# Patient Record
Sex: Male | Born: 1975 | Race: White | Hispanic: No | Marital: Single | Smoking: Never smoker
Health system: Southern US, Community
[De-identification: ages and names within clinical notes are randomized; demographics above are authoritative.]

## PROBLEM LIST (undated history)

## (undated) HISTORY — PX: MANDIBLE RECONSTRUCTION: SHX431

---

## 1997-11-08 ENCOUNTER — Emergency Department (HOSPITAL_COMMUNITY): Admission: EM | Admit: 1997-11-08 | Discharge: 1997-11-08 | Payer: Self-pay | Admitting: Internal Medicine

## 2012-03-05 ENCOUNTER — Encounter (HOSPITAL_COMMUNITY): Payer: Self-pay | Admitting: Emergency Medicine

## 2012-03-05 ENCOUNTER — Emergency Department (HOSPITAL_COMMUNITY)
Admission: EM | Admit: 2012-03-05 | Discharge: 2012-03-05 | Disposition: A | Discharge status: Home / Self Care | Payer: Self-pay | Attending: Emergency Medicine | Admitting: Emergency Medicine

## 2012-03-05 DIAGNOSIS — J3489 Other specified disorders of nose and nasal sinuses: Secondary | ICD-10-CM | POA: Insufficient documentation

## 2012-03-05 DIAGNOSIS — R5381 Other malaise: Secondary | ICD-10-CM | POA: Insufficient documentation

## 2012-03-05 DIAGNOSIS — R197 Diarrhea, unspecified: Secondary | ICD-10-CM | POA: Insufficient documentation

## 2012-03-05 DIAGNOSIS — R05 Cough: Secondary | ICD-10-CM | POA: Insufficient documentation

## 2012-03-05 DIAGNOSIS — R630 Anorexia: Secondary | ICD-10-CM | POA: Insufficient documentation

## 2012-03-05 DIAGNOSIS — J111 Influenza due to unidentified influenza virus with other respiratory manifestations: Secondary | ICD-10-CM

## 2012-03-05 DIAGNOSIS — R110 Nausea: Secondary | ICD-10-CM | POA: Insufficient documentation

## 2012-03-05 DIAGNOSIS — R51 Headache: Secondary | ICD-10-CM | POA: Insufficient documentation

## 2012-03-05 DIAGNOSIS — R6883 Chills (without fever): Secondary | ICD-10-CM | POA: Insufficient documentation

## 2012-03-05 DIAGNOSIS — IMO0001 Reserved for inherently not codable concepts without codable children: Secondary | ICD-10-CM | POA: Insufficient documentation

## 2012-03-05 DIAGNOSIS — R059 Cough, unspecified: Secondary | ICD-10-CM | POA: Insufficient documentation

## 2012-03-05 DIAGNOSIS — Z79899 Other long term (current) drug therapy: Secondary | ICD-10-CM | POA: Insufficient documentation

## 2012-03-05 NOTE — ED Notes (Signed)
Pt c/o not feeling well for for days with generalized body aches and chills.VSS

## 2012-03-05 NOTE — ED Provider Notes (Signed)
History     CSN: [id number]  Arrival date & time 03/05/12  2013   First MD Initiated Contact with Patient 03/05/12 2133      Chief Complaint  Patient presents with  . Generalized Body Aches    (Consider location/radiation/quality/duration/timing/severity/associated sxs/prior treatment) HPI Comments: Denies getting flu shot this year  Patient is a 37 y.o. male presenting with flu symptoms. The history is provided by the patient. No language interpreter was used.  Influenza Presenting symptoms: cough, diarrhea, fatigue, headache, myalgias, nausea and rhinorrhea   Presenting symptoms: no fever, no shortness of breath and no vomiting   Severity:  Mild Onset quality:  Gradual Duration:  2 days Progression:  Waxing and waning Chronicity:  New Relieved by:  Nothing Worsened by:  Nothing tried Ineffective treatments:  Hot fluids Associated symptoms: decreased appetite and decreased physical activity   Associated symptoms: no chills, no ear pain, no congestion and no witnessed syncope   Risk factors: sick contacts     History reviewed. No pertinent past medical history.  History reviewed. No pertinent past surgical history.  No family history on file.  History  Substance Use Topics  . Smoking status: Never Smoker   . Smokeless tobacco: Not on file  . Alcohol Use: No      Review of Systems  Constitutional: Positive for fatigue and decreased appetite. Negative for fever and chills.  HENT: Positive for rhinorrhea. Negative for ear pain and congestion.   Respiratory: Positive for cough. Negative for shortness of breath.   Gastrointestinal: Positive for nausea and diarrhea. Negative for vomiting.  Genitourinary: Negative.   Musculoskeletal: Positive for myalgias.  Neurological: Positive for headaches.    Allergies  Review of patient's allergies indicates no known allergies.  Home Medications   Current Outpatient Rx  Name  Route  Sig  Dispense  Refill  .  amphetamine-dextroamphetamine (ADDERALL) 20 MG tablet   Oral   Take 20 mg by mouth 2 (two) times daily.         . Vilazodone HCl (VIIBRYD) 20 MG TABS   Oral   Take 30 mg by mouth daily.           BP 141/84  Pulse 94  Temp(Src) 97.9 F (36.6 C) (Oral)  Resp 20  SpO2 99%  Physical Exam  Nursing note and vitals reviewed. Constitutional: He is oriented to person, place, and time. He appears well-developed and well-nourished. No distress.  HENT:  Head: Normocephalic and atraumatic.  Right Ear: External ear normal.  Left Ear: External ear normal.  Nose: Nose normal.  Mouth/Throat: Oropharynx is clear and moist. No oropharyngeal exudate.  Eyes: Conjunctivae are normal. Pupils are equal, round, and reactive to light. Right eye exhibits no discharge. Left eye exhibits no discharge. No scleral icterus.  Neck: Normal range of motion.  Cardiovascular: Normal rate, regular rhythm and normal heart sounds.   Pulmonary/Chest: Effort normal and breath sounds normal. No respiratory distress. He has no wheezes. He has no rales.  Abdominal: Soft. He exhibits no distension. There is no tenderness.  Musculoskeletal: Normal range of motion.  Lymphadenopathy:    He has no cervical adenopathy.  Neurological: He is alert and oriented to person, place, and time.  Skin: Skin is warm. No rash noted. He is not diaphoretic. No erythema.  Psychiatric: He has a normal mood and affect. His behavior is normal.    ED Course  Procedures (including critical care time)  Labs Reviewed - No data to display No results  found.   1. Influenza       MDM  Patient presents for body aches and chills x days. Works at [employer] where the flu has been going around through different employees. Associated symptoms include headache, nausea, diarrhea, and rhinorrhea. Is able to tolerate liquids and solids. Patient nontoxic, well appearing, and afebrile in ED. Physical exam benign. Patient's symptoms and exposure  are consistent with a diagnosis of influenza. Patient is outside the window for Tamiflu efficacy. Have verbally advised patient to remain out of work for a week, to rest, remain well hydrated, and to take tylenol or advil as needed for myalgias. Patient states his understanding and comfort with this plan. Patient advised to return to ED if symptoms worsen.  Filed Vitals:   03/05/12 2104  BP: 141/84  Pulse: 94  Temp: 97.9 F (36.6 C)  TempSrc: Oral  Resp: 20  SpO2: 99%          Antony Madura, PA-C 03/06/12 [phone]

## 2012-03-06 NOTE — ED Provider Notes (Signed)
Medical screening examination/treatment/procedure(s) were performed by non-physician practitioner and as supervising physician I was immediately available for consultation/collaboration.   Bobbye Petti M Katana Berthold, MD 03/06/12 1654 

## 2012-09-24 ENCOUNTER — Encounter (HOSPITAL_COMMUNITY): Payer: Self-pay | Admitting: Emergency Medicine

## 2012-09-24 ENCOUNTER — Emergency Department (HOSPITAL_COMMUNITY)
Admission: EM | Admit: 2012-09-24 | Discharge: 2012-09-24 | Disposition: A | Discharge status: Home / Self Care | Payer: Self-pay | Attending: Emergency Medicine | Admitting: Emergency Medicine

## 2012-09-24 DIAGNOSIS — T25429A Corrosion of unspecified degree of unspecified foot, initial encounter: Secondary | ICD-10-CM

## 2012-09-24 DIAGNOSIS — Z79899 Other long term (current) drug therapy: Secondary | ICD-10-CM | POA: Insufficient documentation

## 2012-09-24 DIAGNOSIS — Y929 Unspecified place or not applicable: Secondary | ICD-10-CM | POA: Insufficient documentation

## 2012-09-24 DIAGNOSIS — IMO0002 Reserved for concepts with insufficient information to code with codable children: Secondary | ICD-10-CM | POA: Insufficient documentation

## 2012-09-24 DIAGNOSIS — T23409A Corrosion of unspecified degree of unspecified hand, unspecified site, initial encounter: Secondary | ICD-10-CM

## 2012-09-24 DIAGNOSIS — T25029A Burn of unspecified degree of unspecified foot, initial encounter: Secondary | ICD-10-CM | POA: Insufficient documentation

## 2012-09-24 DIAGNOSIS — Y939 Activity, unspecified: Secondary | ICD-10-CM | POA: Insufficient documentation

## 2012-09-24 MED ORDER — DEXAMETHASONE SODIUM PHOSPHATE 10 MG/ML IJ SOLN
10.0000 mg | Freq: Once | INTRAMUSCULAR | Status: AC
  Administered 2012-09-24: 10 mg via INTRAMUSCULAR
  Filled 2012-09-24: qty 1
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
## 2012-09-24 NOTE — ED Provider Notes (Signed)
Medical screening examination/treatment/procedure(s) were performed by non-physician practitioner and as supervising physician I was immediately available for consultation/collaboration.   Isabeau Mccalla H Darnelle Derrick, MD 09/24/12 2316 
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
## 2016-01-13 ENCOUNTER — Emergency Department (HOSPITAL_COMMUNITY)
Admission: EM | Admit: 2016-01-13 | Discharge: 2016-01-13 | Disposition: A | Discharge status: Home / Self Care | Payer: Self-pay | Attending: Emergency Medicine | Admitting: Emergency Medicine

## 2016-01-13 ENCOUNTER — Emergency Department (HOSPITAL_COMMUNITY): Payer: Self-pay

## 2016-01-13 ENCOUNTER — Encounter (HOSPITAL_COMMUNITY): Payer: Self-pay | Admitting: Emergency Medicine

## 2016-01-13 DIAGNOSIS — M79642 Pain in left hand: Secondary | ICD-10-CM | POA: Insufficient documentation

## 2016-01-13 DIAGNOSIS — R059 Cough, unspecified: Secondary | ICD-10-CM

## 2016-01-13 DIAGNOSIS — R05 Cough: Secondary | ICD-10-CM

## 2016-01-13 DIAGNOSIS — M79641 Pain in right hand: Secondary | ICD-10-CM | POA: Insufficient documentation

## 2016-01-13 DIAGNOSIS — J069 Acute upper respiratory infection, unspecified: Secondary | ICD-10-CM | POA: Insufficient documentation

## 2016-01-13 DIAGNOSIS — R51 Headache: Secondary | ICD-10-CM | POA: Insufficient documentation

## 2016-01-13 DIAGNOSIS — R509 Fever, unspecified: Secondary | ICD-10-CM | POA: Insufficient documentation

## 2016-01-13 LAB — LIPASE, BLOOD: Lipase: 34 U/L (ref 11–51)

## 2016-01-13 LAB — CBC WITH DIFFERENTIAL/PLATELET
BASOS PCT: 1 %
Basophils Absolute: 0.1 10*3/uL (ref 0.0–0.1)
Eosinophils Absolute: 0.3 10*3/uL (ref 0.0–0.7)
Eosinophils Relative: 3 %
HEMATOCRIT: 44.2 % (ref 39.0–52.0)
HEMOGLOBIN: 15.4 g/dL (ref 13.0–17.0)
LYMPHS ABS: 2.6 10*3/uL (ref 0.7–4.0)
LYMPHS PCT: 27 %
MCH: 28.8 pg (ref 26.0–34.0)
MCHC: 34.8 g/dL (ref 30.0–36.0)
MCV: 82.8 fL (ref 78.0–100.0)
MONO ABS: 1 10*3/uL (ref 0.1–1.0)
MONOS PCT: 10 %
NEUTROS ABS: 5.6 10*3/uL (ref 1.7–7.7)
NEUTROS PCT: 59 %
Platelets: 282 10*3/uL (ref 150–400)
RBC: 5.34 MIL/uL (ref 4.22–5.81)
RDW: 13 % (ref 11.5–15.5)
WBC: 9.5 10*3/uL (ref 4.0–10.5)

## 2016-01-13 LAB — URINALYSIS, ROUTINE W REFLEX MICROSCOPIC
Bilirubin Urine: NEGATIVE
GLUCOSE, UA: NEGATIVE mg/dL
Hgb urine dipstick: NEGATIVE
KETONES UR: NEGATIVE mg/dL
LEUKOCYTES UA: NEGATIVE
Nitrite: NEGATIVE
PH: 7 (ref 5.0–8.0)
Protein, ur: NEGATIVE mg/dL
Specific Gravity, Urine: 1.009 (ref 1.005–1.030)

## 2016-01-13 LAB — I-STAT CG4 LACTIC ACID, ED: LACTIC ACID, VENOUS: 1.87 mmol/L (ref 0.5–1.9)

## 2016-01-13 LAB — COMPREHENSIVE METABOLIC PANEL
ALBUMIN: 4.2 g/dL (ref 3.5–5.0)
ALK PHOS: 53 U/L (ref 38–126)
ALT: 73 U/L — ABNORMAL HIGH (ref 17–63)
ANION GAP: 10 (ref 5–15)
AST: 39 U/L (ref 15–41)
BILIRUBIN TOTAL: 0.5 mg/dL (ref 0.3–1.2)
BUN: 17 mg/dL (ref 6–20)
CALCIUM: 9.7 mg/dL (ref 8.9–10.3)
CO2: 27 mmol/L (ref 22–32)
Chloride: 100 mmol/L — ABNORMAL LOW (ref 101–111)
Creatinine, Ser: 0.82 mg/dL (ref 0.61–1.24)
GFR calc Af Amer: >60 mL/min (ref >60)
GLUCOSE: 101 mg/dL — AB (ref 65–99)
Potassium: 3.9 mmol/L (ref 3.5–5.1)
Sodium: 137 mmol/L (ref 135–145)
TOTAL PROTEIN: 7.3 g/dL (ref 6.5–8.1)

## 2016-01-13 LAB — PROTIME-INR
INR: 0.88
PROTHROMBIN TIME: 11.9 s (ref 11.4–15.2)

## 2016-01-13 LAB — D-DIMER, QUANTITATIVE: D-Dimer, Quant: <0.27 ug/mL-FEU (ref 0.00–0.50)

## 2016-01-13 MED ORDER — DEXAMETHASONE SODIUM PHOSPHATE 10 MG/ML IJ SOLN
10.0000 mg | Freq: Once | INTRAMUSCULAR | Status: AC
  Administered 2016-01-13: 10 mg via INTRAVENOUS
  Filled 2016-01-13: qty 1

## 2016-01-13 MED ORDER — SODIUM CHLORIDE 0.9 % IV BOLUS (SEPSIS)
1000.0000 mL | Freq: Once | INTRAVENOUS | Status: AC
  Administered 2016-01-13: 1000 mL via INTRAVENOUS

## 2016-01-13 MED ORDER — PROCHLORPERAZINE EDISYLATE 5 MG/ML IJ SOLN
10.0000 mg | Freq: Once | INTRAMUSCULAR | Status: AC
  Administered 2016-01-13: 10 mg via INTRAVENOUS
  Filled 2016-01-13: qty 2

## 2016-01-13 MED ORDER — DIPHENHYDRAMINE HCL 50 MG/ML IJ SOLN
25.0000 mg | Freq: Once | INTRAMUSCULAR | Status: AC
  Administered 2016-01-13: 25 mg via INTRAVENOUS
  Filled 2016-01-13: qty 1
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
## 2016-01-13 NOTE — Discharge Instructions (Signed)
Please follow-up with your PCP for further management of your symptoms. Please follow-up with your pain team for further pain management. Please follow-up with Dr. Lafayette Dragonarr for further management of her depression. If any symptoms worsen or new symptoms arise, please return to the nearest emergency department.

## 2016-01-13 NOTE — ED Triage Notes (Signed)
Patient reports "sore kidneys 2 weeks ago."This was around the time the patient had medication change. Also, reports intermittent headaches, sore throat, and bilateral ear pain x1 week. Also reports left middle finger pain after a fall a few weeks ago.

## 2016-01-13 NOTE — ED Notes (Signed)
Pt has a urinal at bedside and is aware that a sample is needed.

## 2016-01-14 LAB — URINE CULTURE: Culture: NO GROWTH

## 2018-07-04 IMAGING — CR DG CHEST 2V
2 series · 2 of 2 positions shown · non-contrast
Comparison: None.

CLINICAL DATA: Cough.  Fever.

EXAM:
CHEST  2 VIEW

[w chest pa]
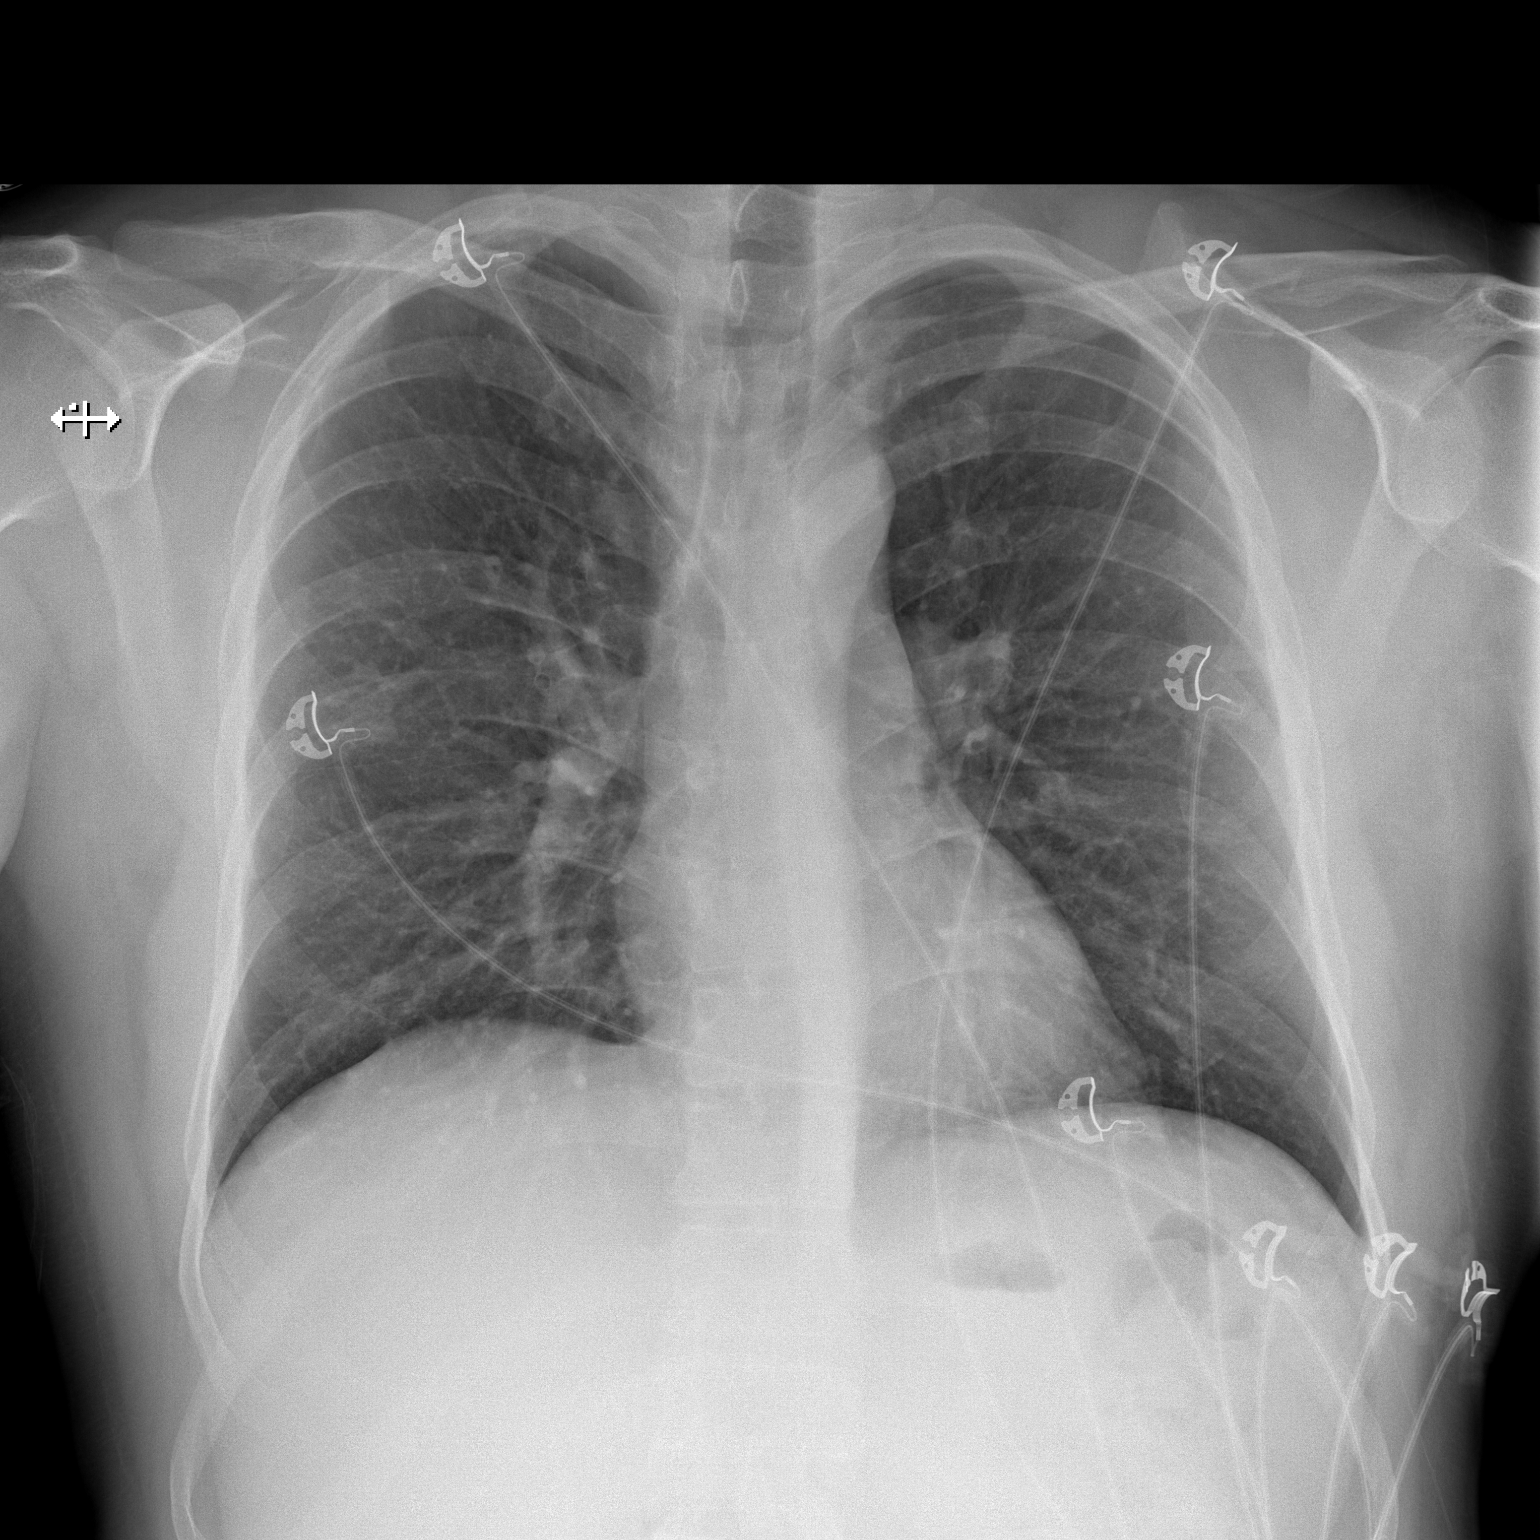

[w chest lat]
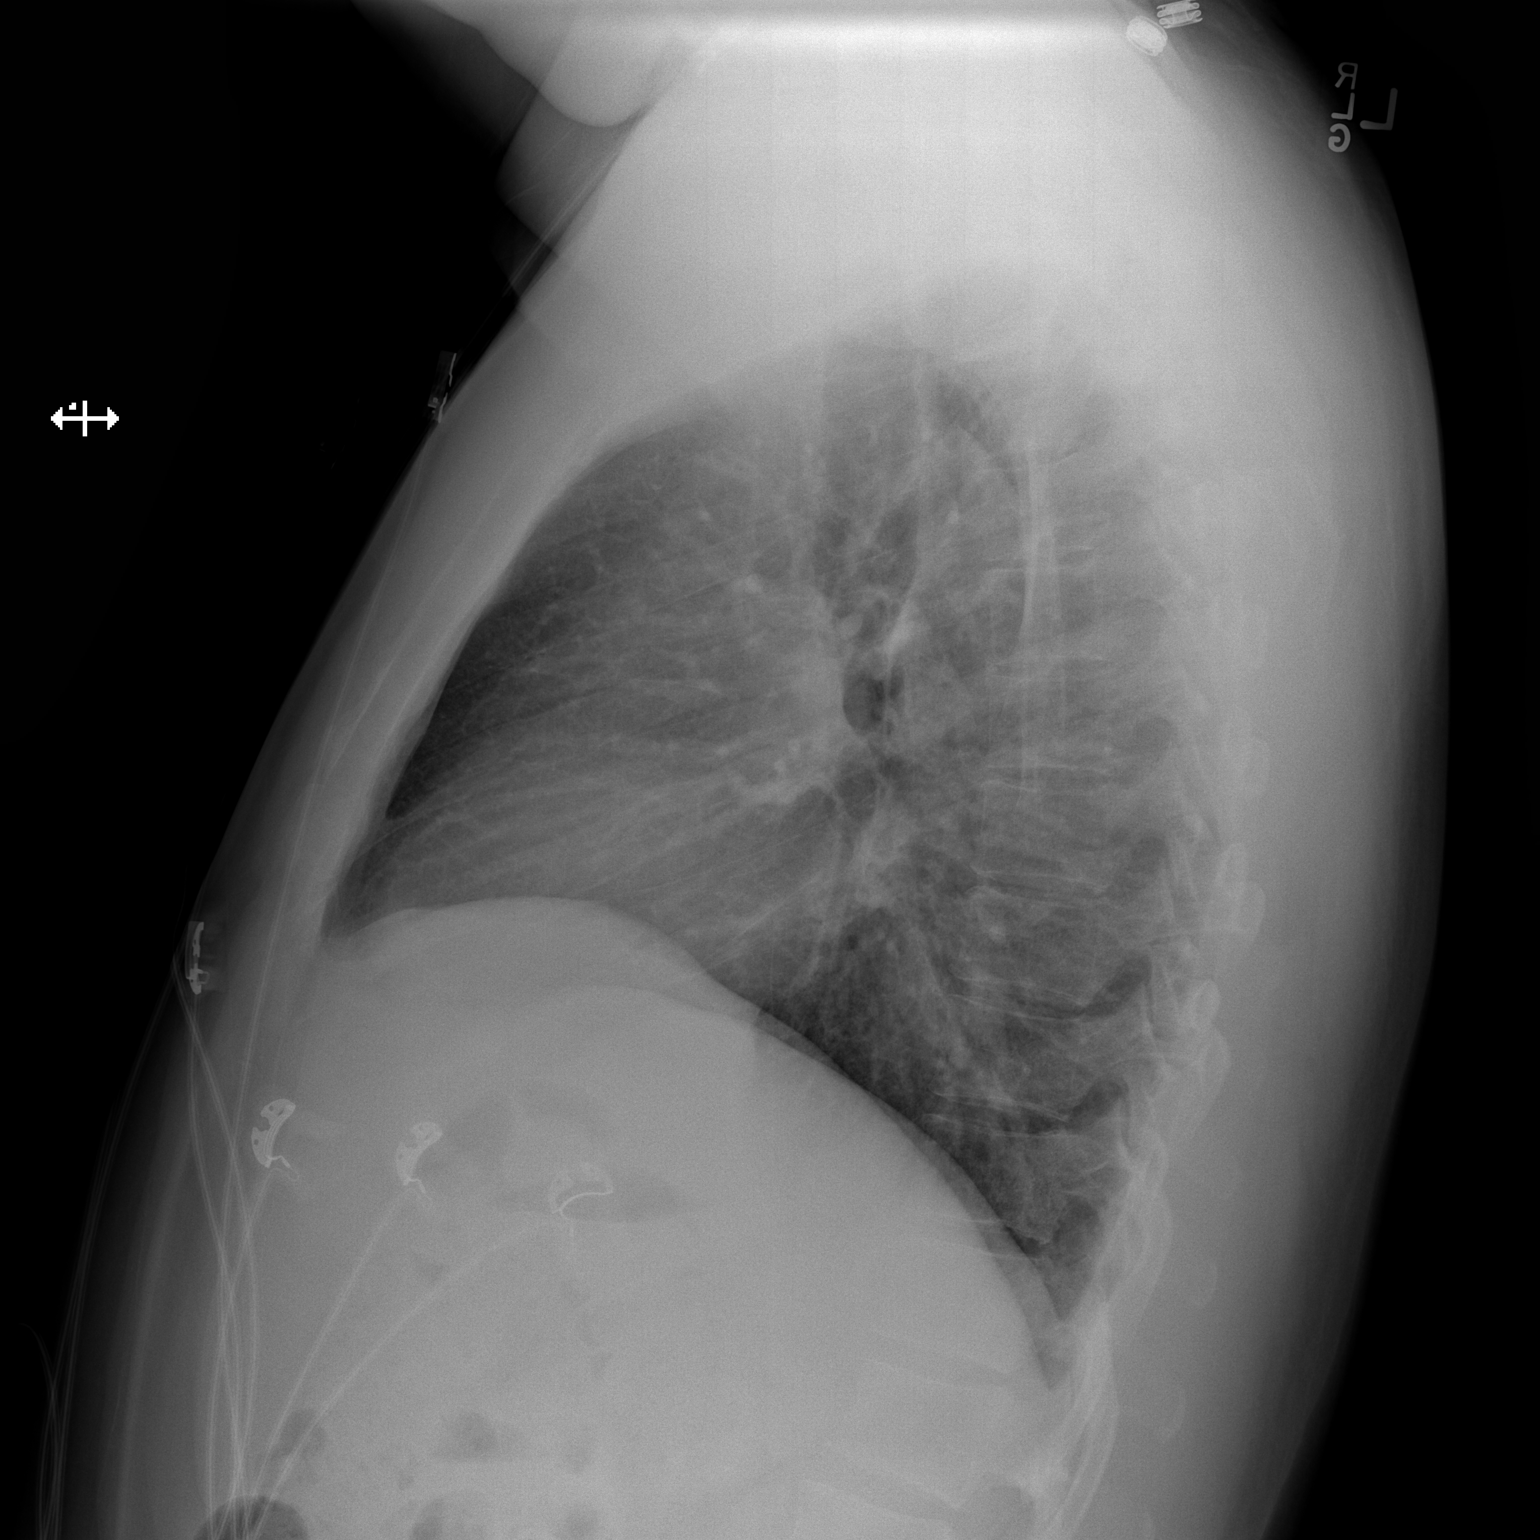

[2 of 2 positions shown; findings below may reference images not displayed]

FINDINGS: The heart size and mediastinal contours are within normal limits.
Both lungs are clear. The visualized skeletal structures are
unremarkable.
IMPRESSION: No active cardiopulmonary disease.

## 2018-07-04 IMAGING — CR DG HAND COMPLETE 3+V*L*
3 series · 3 of 3 positions shown · non-contrast
Comparison: None.

CLINICAL DATA: Bilateral chronic hand pain.

EXAM:
LEFT HAND - COMPLETE 3+ VIEW

[x hand pa left]
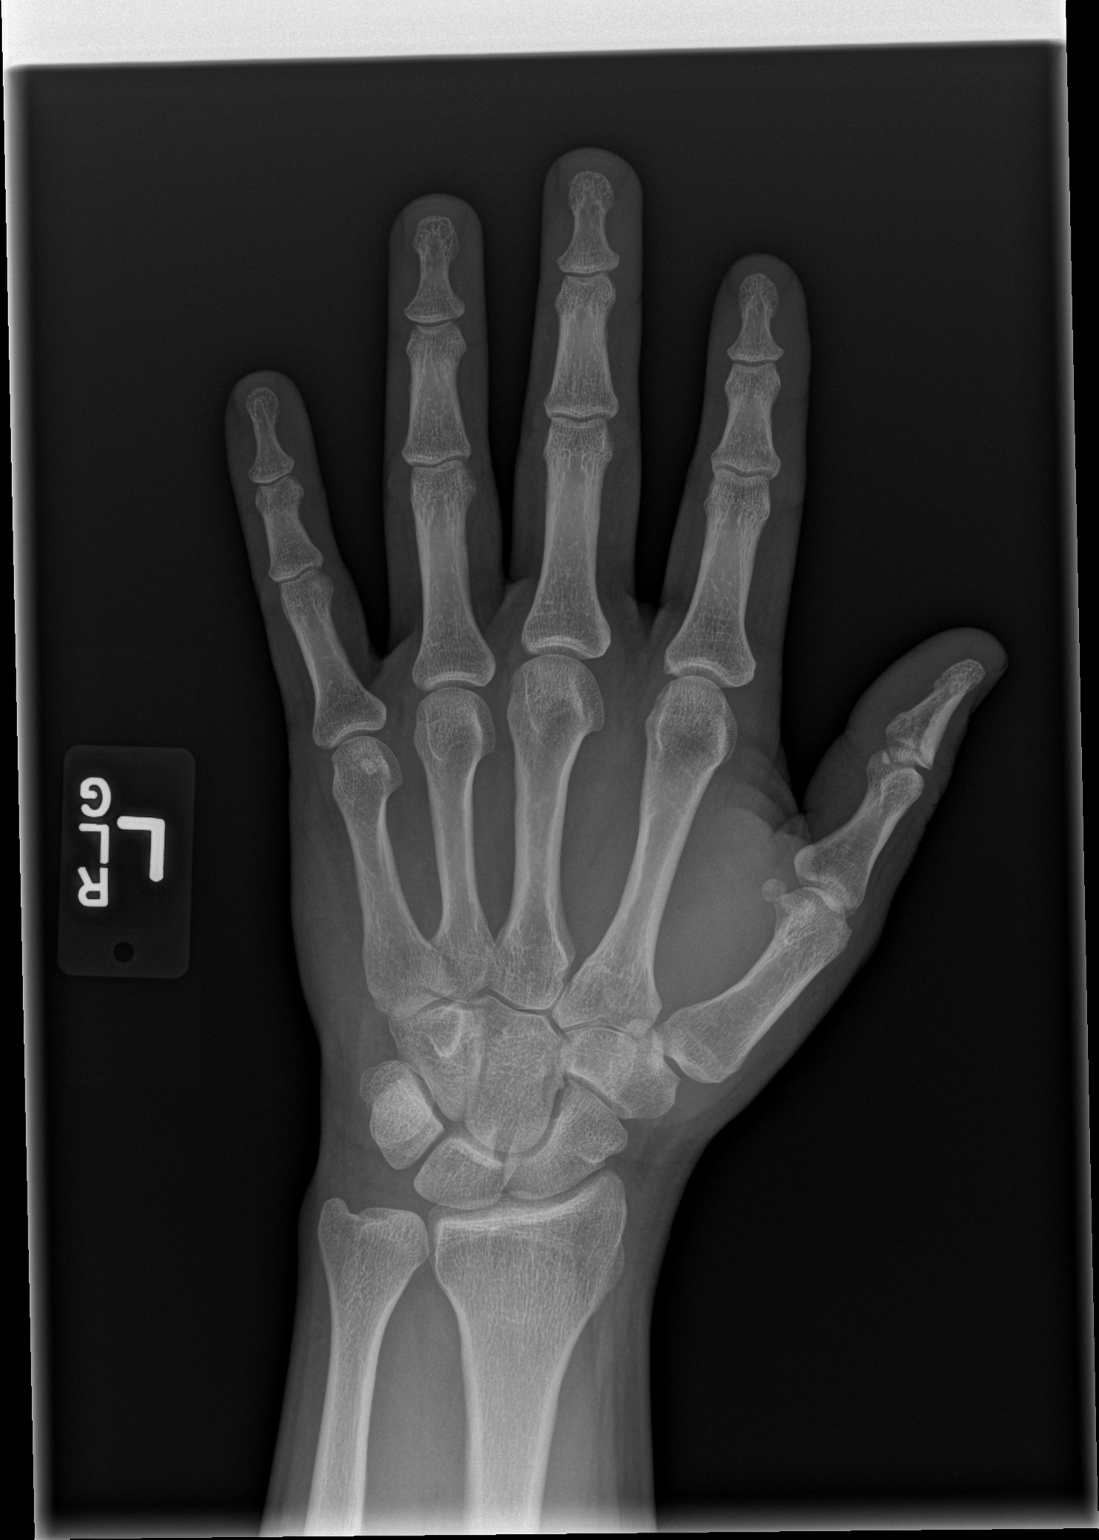

[x hand obl left]
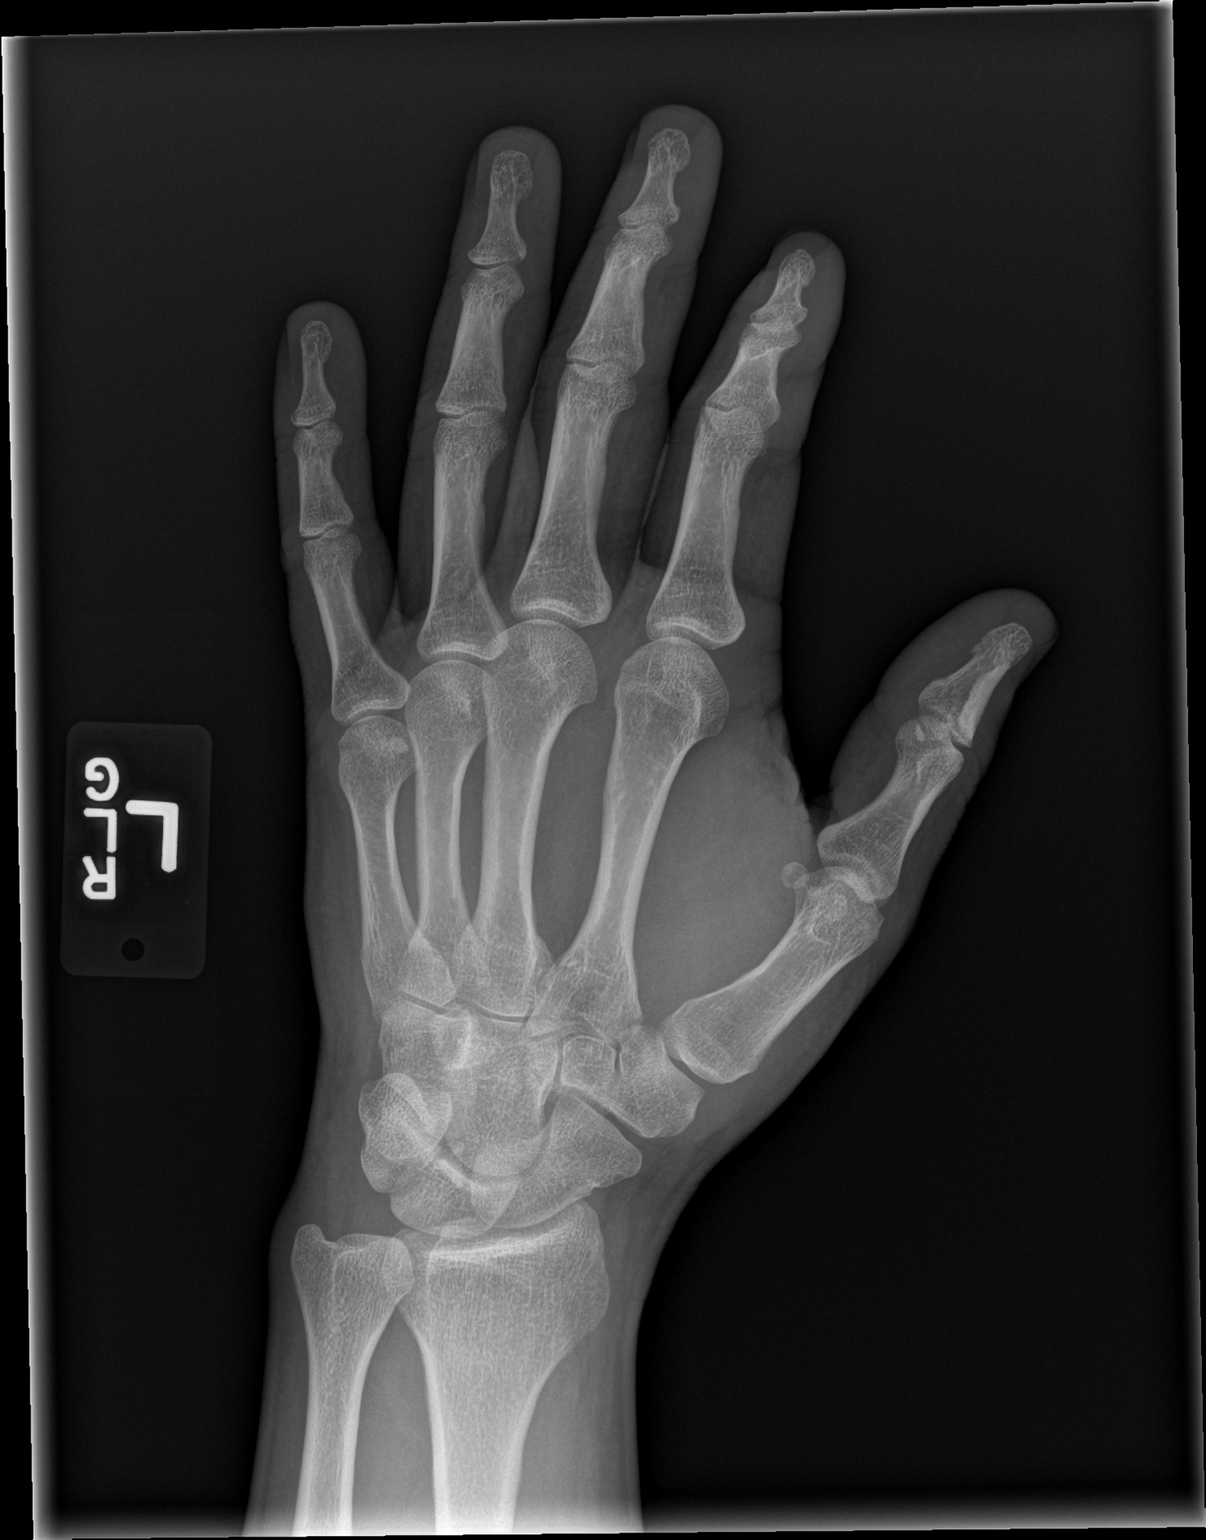

[x hand lat left]
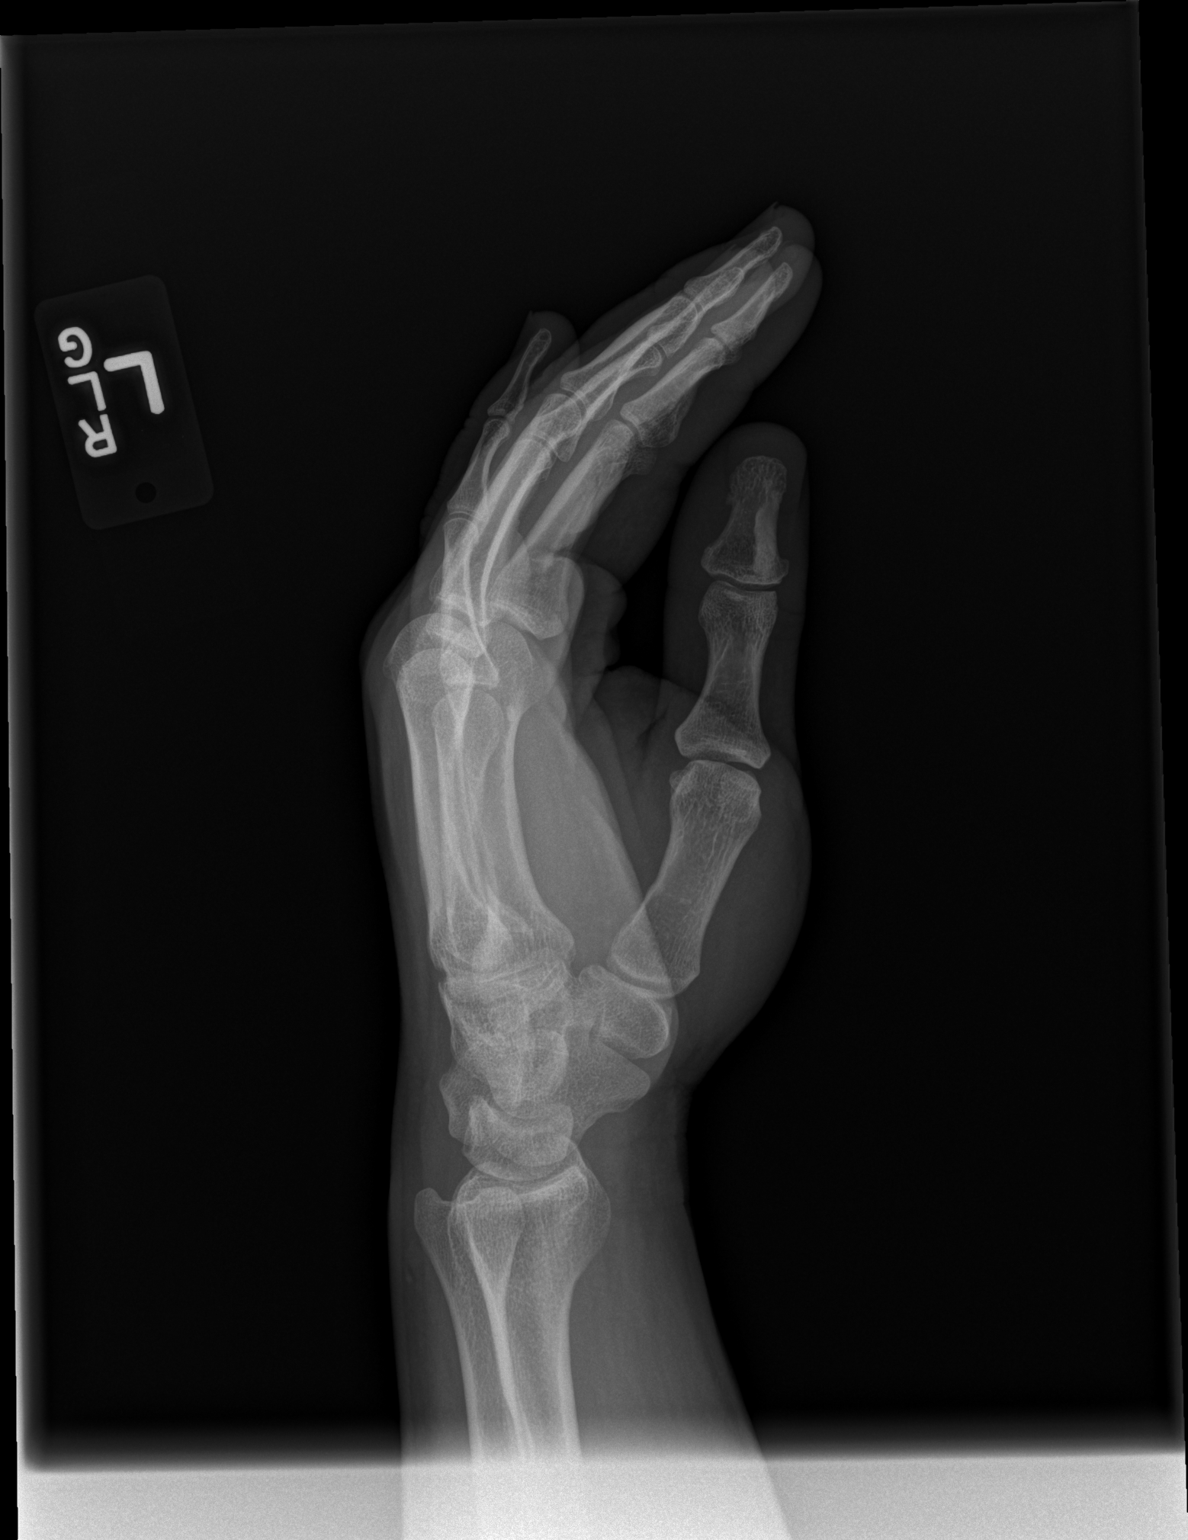

[3 of 3 positions shown; findings below may reference images not displayed]

FINDINGS: Dorsal-lateral sclerosis/ cortical thickening in the distal phalanx
of the thumb. No significant arthropathy identified. Otherwise
negative exam.
IMPRESSION: 1. Elongated sclerotic cortex or lesion along the cortex in the
thumb, possibly a bone island or localized melorheostosis.

## 2019-07-10 ENCOUNTER — Ambulatory Visit: Payer: Self-pay | Admitting: Allergy and Immunology

## 2019-08-12 ENCOUNTER — Ambulatory Visit: Payer: Self-pay | Admitting: Allergy and Immunology
